# Patient Record
Sex: Male | Born: 1990 | Race: White | Hispanic: No | Marital: Married
Health system: Southern US, Community
[De-identification: ages and names within clinical notes are randomized; demographics above are authoritative.]

---

## 2013-12-18 ENCOUNTER — Other Ambulatory Visit: Payer: Self-pay | Admitting: Physician Assistant

## 2013-12-18 DIAGNOSIS — R748 Abnormal levels of other serum enzymes: Secondary | ICD-10-CM

## 2014-03-30 ENCOUNTER — Other Ambulatory Visit: Payer: Self-pay | Admitting: Physician Assistant

## 2014-03-30 ENCOUNTER — Ambulatory Visit
Admission: RE | Admit: 2014-03-30 | Discharge: 2014-03-30 | Disposition: A | Discharge status: Home / Self Care | Payer: BC Managed Care – PPO | Source: Ambulatory Visit | Attending: Physician Assistant | Admitting: Physician Assistant

## 2014-03-30 DIAGNOSIS — W19XXXA Unspecified fall, initial encounter: Secondary | ICD-10-CM

## 2016-03-27 IMAGING — CR DG SHOULDER 2+V*R*
3 series · 3 of 3 positions shown · non-contrast
Comparison: None.

CLINICAL DATA: Right shoulder pain following injury 2 weeks
previous, initial encounter

EXAM:
RIGHT SHOULDER - 2+ VIEW

[w shoulder ap internal righ]
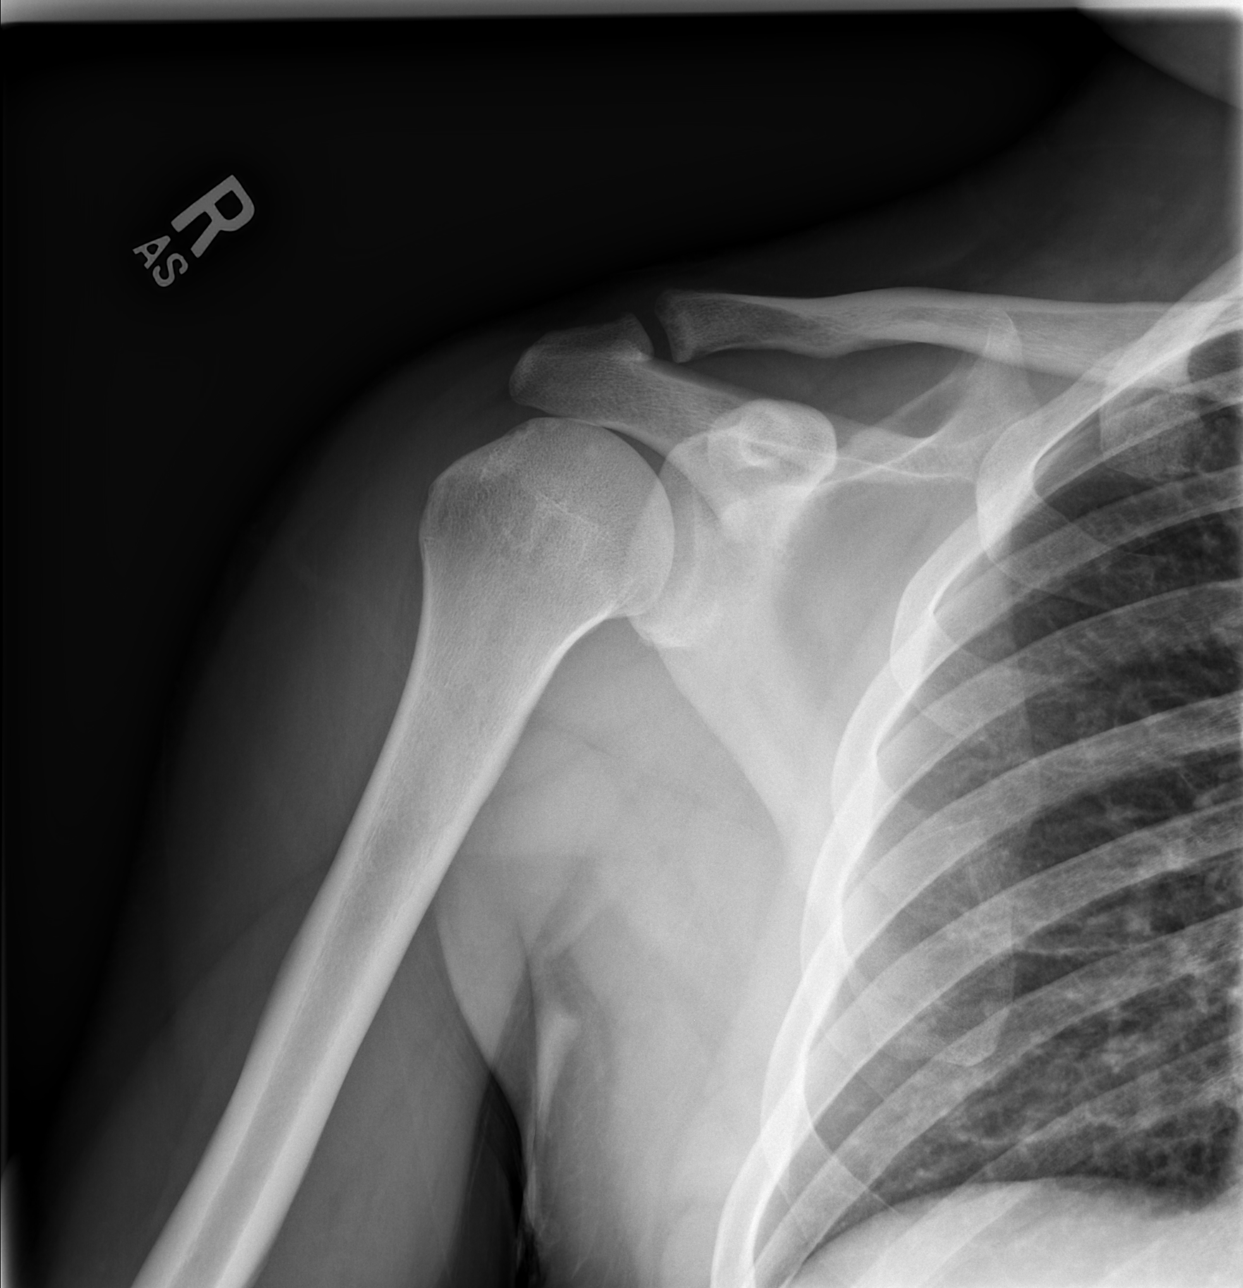

[w shoulder y view right *]
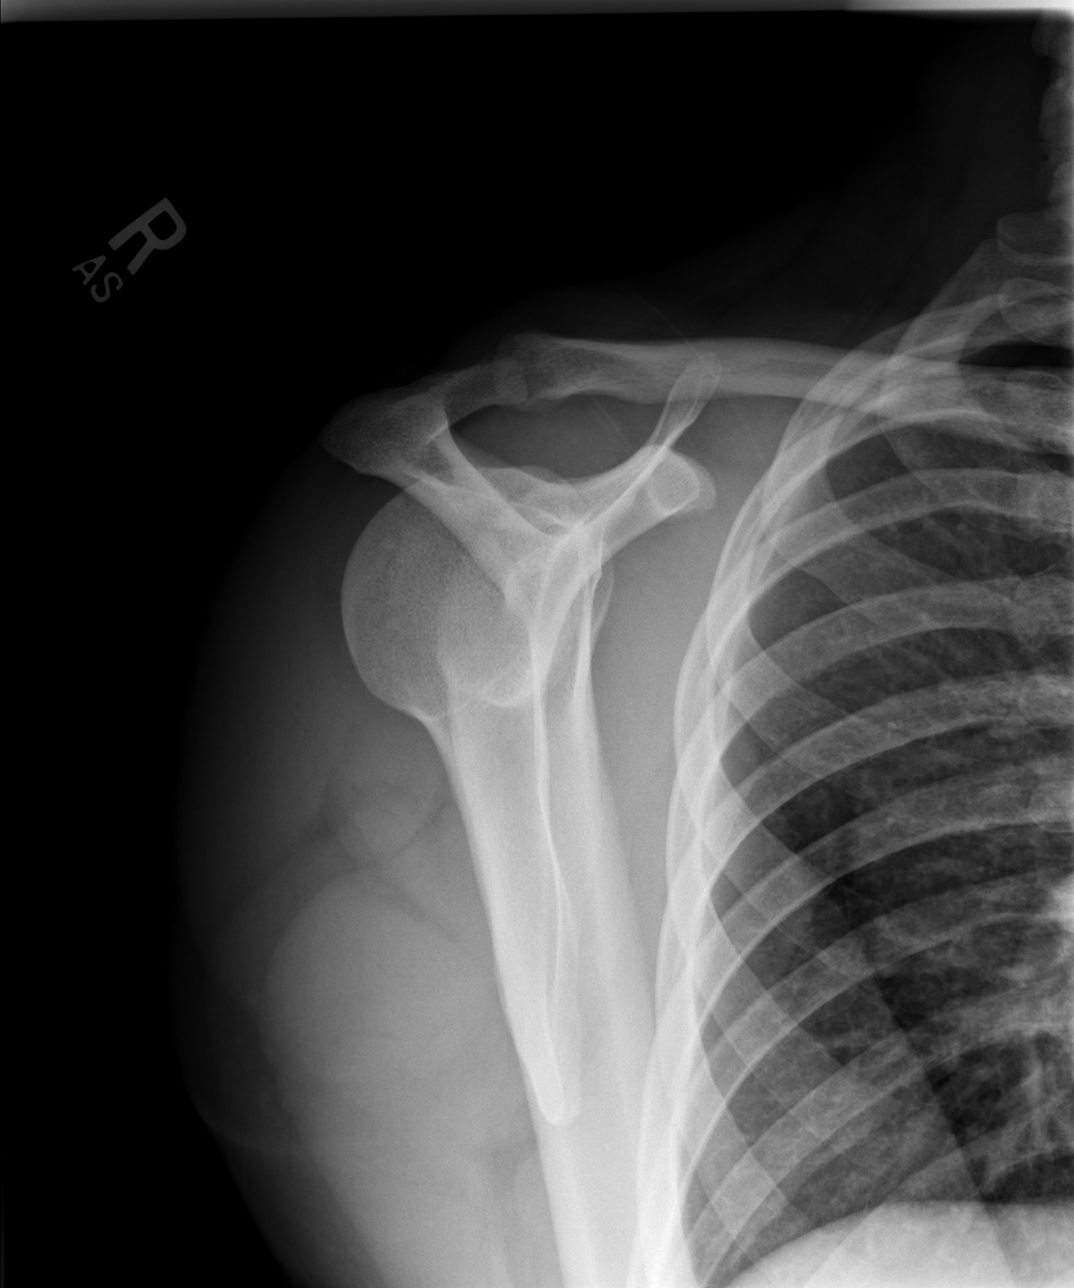

[w shoulder axillary right *]
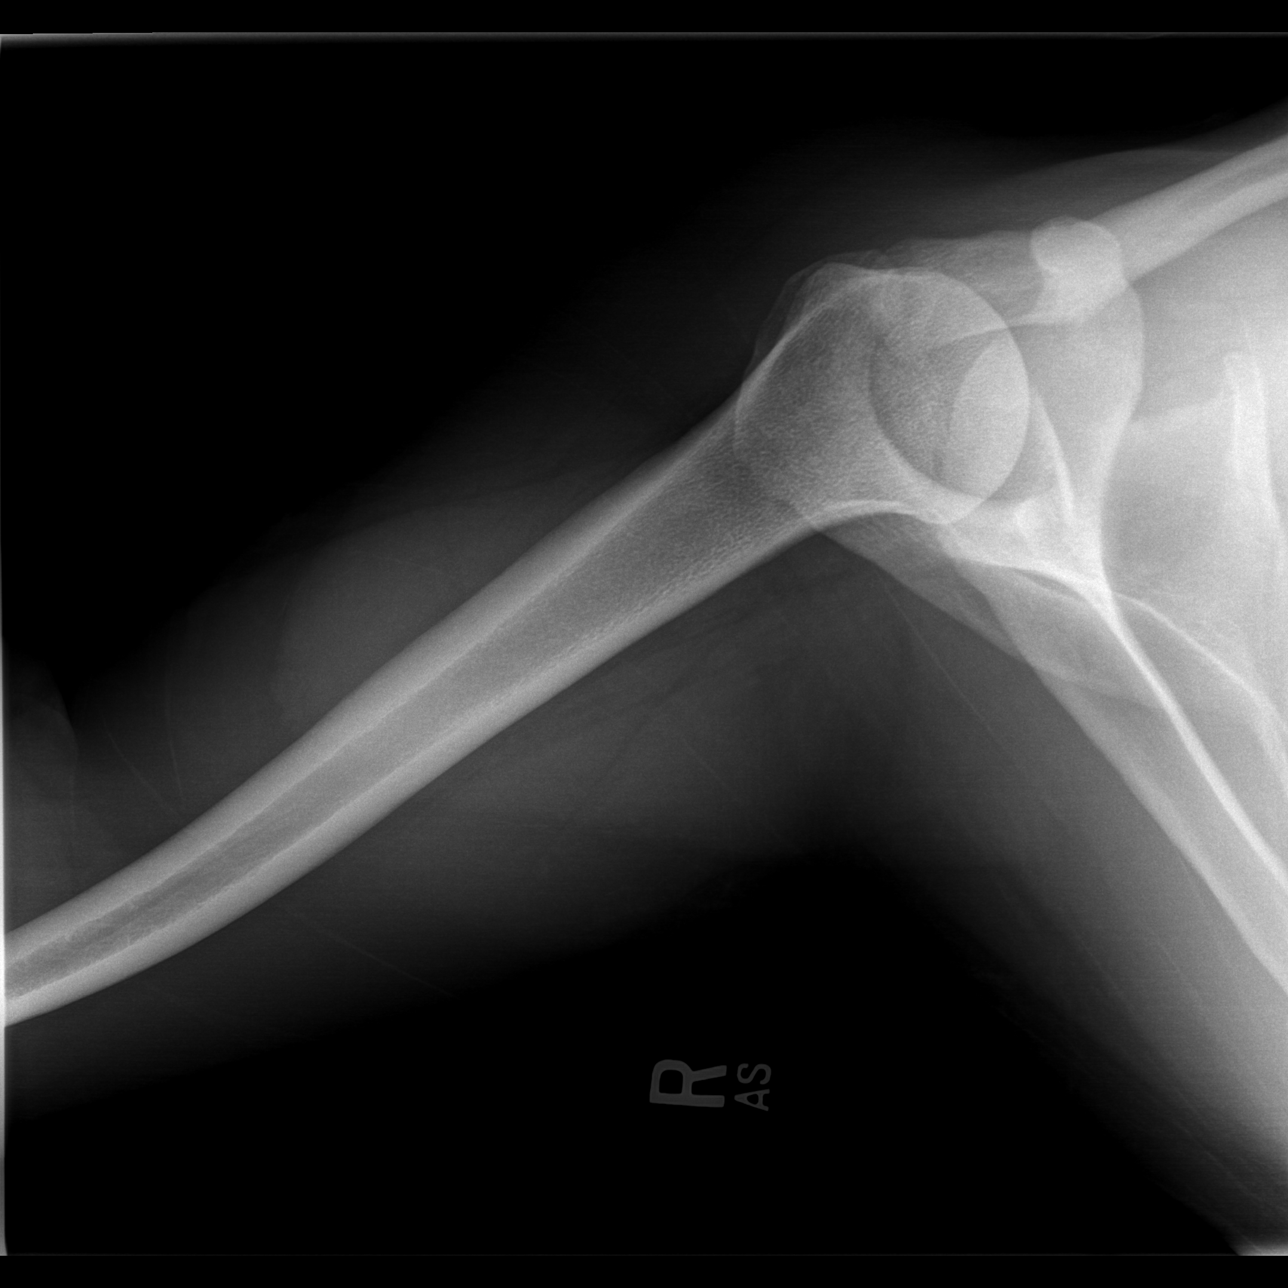

[3 of 3 positions shown; findings below may reference images not displayed]

FINDINGS: There is no evidence of fracture or dislocation. There is no
evidence of arthropathy or other focal bone abnormality. Soft
tissues are unremarkable.
IMPRESSION: No acute abnormality noted.

## 2017-07-25 ENCOUNTER — Ambulatory Visit (INDEPENDENT_AMBULATORY_CARE_PROVIDER_SITE_OTHER): Payer: BLUE CROSS/BLUE SHIELD | Admitting: Family

## 2017-07-25 ENCOUNTER — Ambulatory Visit (INDEPENDENT_AMBULATORY_CARE_PROVIDER_SITE_OTHER): Payer: BLUE CROSS/BLUE SHIELD

## 2017-07-25 ENCOUNTER — Encounter (INDEPENDENT_AMBULATORY_CARE_PROVIDER_SITE_OTHER): Payer: Self-pay | Admitting: Family

## 2017-07-25 DIAGNOSIS — S93412A Sprain of calcaneofibular ligament of left ankle, initial encounter: Secondary | ICD-10-CM

## 2017-07-25 DIAGNOSIS — M25572 Pain in left ankle and joints of left foot: Secondary | ICD-10-CM

## 2017-07-25 DIAGNOSIS — M79672 Pain in left foot: Secondary | ICD-10-CM

## 2017-07-25 DIAGNOSIS — S9032XA Contusion of left foot, initial encounter: Secondary | ICD-10-CM | POA: Diagnosis not present

## 2017-07-25 MED ORDER — HYDROCODONE-ACETAMINOPHEN 5-325 MG PO TABS: 1.0000 | ORAL_TABLET | Freq: Four times a day (QID) | ORAL | 0 refills | Status: AC | PRN

## 2017-07-25 NOTE — Progress Notes (Signed)
   Office Visit Note   Patient: Brendan Brown           Date of Birth: 08/25/1990           MRN: [id number] Visit Date: 07/25/2017              Requested by: No referring provider defined for this encounter. PCP: Patient, No Pcp Per  No chief complaint on file.     HPI: Patient is a 27 year old gentleman who presents today for evaluation of pain and swelling to his left foot and ankle following a metal tower falling on his [place] at the [employer] this past Saturday.  Is in a Cam walker.  States he did go to urgent care at [facility].  Radiographs of his ankle were negative at that time does not have copies or disc today.  Assessment & Plan: Visit Diagnoses:  1. Pain in left ankle and joints of left foot   2. Pain in left foot     Plan: Continue the CAM boot for comfort with ambulation. Ice and elevation for swelling. Reassurance provided today. Follow up in office in 2 weeks if ongoing pain or difficulty with weight bearing may repeat radiographs of foot and ankle at follow up.  Follow-Up Instructions: No Follow-up on file.   Left Ankle Exam   Tenderness  The patient is experiencing tenderness in the ATF, CF and deltoid.  Swelling: severe  Range of Motion  The patient has normal left ankle ROM.   Tests  Anterior drawer: negative  Other  Erythema: absent Sensation: normal  Comments:  Ecchymosis to foot and ankle  Good strength with dorsiflexion and plantarflexion. Able to resist eversion and inversion.       Patient is alert, oriented, no adenopathy, well-dressed, normal affect, normal respiratory effort.   States can only bear weight in the boot has had worsening swelling and bruising  Imaging: No results found. No images are attached to the encounter.  Labs: No results found for: HGBA1C, ESRSEDRATE, CRP, LABURIC, REPTSTATUS, GRAMSTAIN, CULT, LABORGA  @LABSALLVALUES (HGBA1)@  There is no height or weight on file to calculate BMI.  Orders:  Orders  Placed This Encounter  Procedures  . XR Ankle Complete Left  . XR Foot Complete Left   Meds ordered this encounter  Medications  . HYDROcodone-acetaminophen (NORCO/VICODIN) 5-325 MG tablet    Sig: Take 1 tablet by mouth every 6 (six) hours as needed for moderate pain.    Dispense:  28 tablet    Refill:  0     Procedures: No procedures performed  Clinical Data: No additional findings.  ROS:  All other systems negative, except as noted in the HPI. Review of Systems  Constitutional: Negative for chills and fever.  Musculoskeletal: Positive for arthralgias, joint swelling and myalgias.  Skin: Positive for color change. Negative for wound.    Objective: Vital Signs: There were no vitals taken for this visit.  Specialty Comments:  No specialty comments available.  PMFS History: There are no active problems to display for this patient.  History reviewed. No pertinent past medical history.  History reviewed. No pertinent family history.  History reviewed. No pertinent surgical history. Social History   Occupational History  . Not on file  Tobacco Use  . Smoking status: Not on file  Substance and Sexual Activity  . Alcohol use: Not on file  . Drug use: Not on file  . Sexual activity: Not on file

## 2017-08-09 ENCOUNTER — Encounter (INDEPENDENT_AMBULATORY_CARE_PROVIDER_SITE_OTHER): Payer: Self-pay | Admitting: Orthopedic Surgery

## 2017-08-09 ENCOUNTER — Ambulatory Visit (INDEPENDENT_AMBULATORY_CARE_PROVIDER_SITE_OTHER): Payer: BLUE CROSS/BLUE SHIELD | Admitting: Orthopedic Surgery

## 2017-08-09 ENCOUNTER — Ambulatory Visit (INDEPENDENT_AMBULATORY_CARE_PROVIDER_SITE_OTHER): Payer: BLUE CROSS/BLUE SHIELD

## 2017-08-09 DIAGNOSIS — M25572 Pain in left ankle and joints of left foot: Secondary | ICD-10-CM | POA: Diagnosis not present

## 2017-08-09 DIAGNOSIS — M79672 Pain in left foot: Secondary | ICD-10-CM | POA: Diagnosis not present

## 2017-08-09 DIAGNOSIS — S9032XA Contusion of left foot, initial encounter: Secondary | ICD-10-CM | POA: Diagnosis not present

## 2017-08-09 NOTE — Progress Notes (Signed)
   Office Visit Note   Patient: Brendan Brown           Date of Birth: 10/22/1990           MRN: [id number] Visit Date: 08/09/2017              Requested by: No referring provider defined for this encounter. PCP: Patient, No Pcp Per  Chief Complaint  Patient presents with  . Left Foot - Pain    DOI 07/21/17 metal tower fell on foot.   . Left Ankle - Pain      HPI: Patient presents in follow-up status post crush injury to the left foot on 07/21/2017.  Patient states that a metal tower fell on his left foot.  He is currently in a fracture boot complains of pain swelling dorsally and laterally.  Assessment & Plan: Visit Diagnoses:  1. Pain in left foot   2. Acute left ankle pain   3. Contusion of left foot, initial encounter     Plan: Recommended medical compression stockings to be worn up to 24 hours a day as he tolerates.  Recommended ice and elevation continue with the fracture boot he will be out of work until follow-up in 3 weeks and reevaluate at that time to follow-up for work.  Follow-Up Instructions: Return in about 3 weeks (around 08/30/2017).   Ortho Exam  Patient is alert, oriented, no adenopathy, well-dressed, normal affect, normal respiratory effort. Examination patient has a good dorsalis pedis and posterior tibial pulse there is ecchymosis and bruising dorsally and laterally there are no blisters there is no cellulitis.  Patient has active plantar flexion and dorsiflexion of the ankle he has active flexion and extension of the toes there is no signs or symptoms of a compartment syndrome.  Patient has most weakness with the eversion of his foot consistent with the swelling laterally.  Imaging: Xr Ankle 2 Views Left  Result Date: 08/09/2017 2 view radiographs of the left ankle shows no syndesmotic injury no osteochondral defect no fracture.  Xr Foot 2 Views Left  Result Date: 08/09/2017 2 view radiographs of the left foot shows no evidence of a fracture there is no  widening of the Lisfranc complex.  No images are attached to the encounter.  Labs: No results found for: HGBA1C, ESRSEDRATE, CRP, LABURIC, REPTSTATUS, GRAMSTAIN, CULT, LABORGA  @LABSALLVALUES (HGBA1)@  There is no height or weight on file to calculate BMI.  Orders:  Orders Placed This Encounter  Procedures  . XR Foot 2 Views Left  . XR Ankle 2 Views Left   No orders of the defined types were placed in this encounter.    Procedures: No procedures performed  Clinical Data: No additional findings.  ROS:  All other systems negative, except as noted in the HPI. Review of Systems  Objective: Vital Signs: There were no vitals taken for this visit.  Specialty Comments:  No specialty comments available.  PMFS History: There are no active problems to display for this patient.  History reviewed. No pertinent past medical history.  History reviewed. No pertinent family history.  History reviewed. No pertinent surgical history. Social History   Occupational History  . Not on file  Tobacco Use  . Smoking status: Not on file  Substance and Sexual Activity  . Alcohol use: Not on file  . Drug use: Not on file  . Sexual activity: Not on file

## 2017-08-30 ENCOUNTER — Ambulatory Visit (INDEPENDENT_AMBULATORY_CARE_PROVIDER_SITE_OTHER): Payer: BLUE CROSS/BLUE SHIELD | Admitting: Orthopedic Surgery

## 2017-08-30 ENCOUNTER — Encounter (INDEPENDENT_AMBULATORY_CARE_PROVIDER_SITE_OTHER): Payer: Self-pay | Admitting: Orthopedic Surgery

## 2017-08-30 VITALS — Ht 70.0 in | Wt 215.0 lb

## 2017-08-30 DIAGNOSIS — S9032XA Contusion of left foot, initial encounter: Secondary | ICD-10-CM | POA: Diagnosis not present

## 2017-08-30 DIAGNOSIS — G90522 Complex regional pain syndrome I of left lower limb: Secondary | ICD-10-CM

## 2017-08-30 MED ORDER — DULOXETINE HCL 30 MG PO CPEP: 30.0000 mg | ORAL_CAPSULE | Freq: Every day | ORAL | 3 refills | Status: AC

## 2017-08-30 MED ORDER — GABAPENTIN 300 MG PO CAPS: 300.0000 mg | ORAL_CAPSULE | Freq: Three times a day (TID) | ORAL | 3 refills | Status: AC

## 2017-08-30 NOTE — Progress Notes (Signed)
Office Visit Note   Patient: Brendan Brown           Date of Birth: 08/18/1990           MRN: [id number] Visit Date: 08/30/2017              Requested by: No referring provider defined for this encounter. PCP: Patient, No Pcp Per  Chief Complaint  Patient presents with  . Left Ankle - Pain, Follow-up, Edema    DOI 07/21/17 metal tower fell on left ankle/foot  . Left Foot - Pain, Follow-up, Edema      HPI: Patient is a 27 year old gentleman who presents in follow-up status post crush injury to the left foot approximately 6 weeks ago.  Patient states he can move his ankle and toes but still has decreased range of motion.  Patient states that he is having increasing pain with light touch and even water hitting the foot in the shower is painful.  Patient states is been wearing the fracture boot but has been attempting to wear regular shoewear.  Patient is still out of work.  Assessment & Plan: Visit Diagnoses:  1. Complex regional pain syndrome type 1 of left lower extremity   2. Contusion of left foot, initial encounter     Plan: Discussed with the patient he is developing complex regional pain type I symptoms.  We will start him on Neurontin he will start off with 300 mg at night and increase up to 300 mg 3 times a day as he tolerates.  He will use the Cymbalta every morning.  Discussed the importance of increasing his activities as tolerated he was given a note to be out of work for 4 additional weeks.  Follow-Up Instructions: Return in about 1 week (around 09/06/2017).   Ortho Exam  Patient is alert, oriented, no adenopathy, well-dressed, normal affect, normal respiratory effort. Examination patient has a good dorsalis pedis and posterior tibial pulse there is minimal swelling patient's foot is red compared to the right foot with dystrophic changes he still has hair growth he has pain to light touch.  He can actively plantarflex and dorsiflex the ankle and can wiggle his toes no  signs of compartment syndrome.  Patient is developing signs and symptoms of complex regional pain syndrome type I.  Imaging: No results found. No images are attached to the encounter.  Labs: No results found for: HGBA1C, ESRSEDRATE, CRP, LABURIC, REPTSTATUS, GRAMSTAIN, CULT, LABORGA  @LABSALLVALUES (HGBA1)@  Body mass index is 30.85 kg/m.  Orders:  No orders of the defined types were placed in this encounter.  No orders of the defined types were placed in this encounter.    Procedures: No procedures performed  Clinical Data: No additional findings.  ROS:  All other systems negative, except as noted in the HPI. Review of Systems  Objective: Vital Signs: Ht 5\' 10"  (1.778 m)   Wt 215 lb (97.5 kg)   BMI 30.85 kg/m   Specialty Comments:  No specialty comments available.  PMFS History: There are no active problems to display for this patient.  History reviewed. No pertinent past medical history.  History reviewed. No pertinent family history.  History reviewed. No pertinent surgical history. Social History   Occupational History  . Not on file  Tobacco Use  . Smoking status: Not on file  Substance and Sexual Activity  . Alcohol use: Not on file  . Drug use: Not on file  . Sexual activity: Not on file

## 2017-09-10 ENCOUNTER — Ambulatory Visit (INDEPENDENT_AMBULATORY_CARE_PROVIDER_SITE_OTHER): Payer: BLUE CROSS/BLUE SHIELD | Admitting: Orthopedic Surgery

## 2017-10-01 ENCOUNTER — Ambulatory Visit (INDEPENDENT_AMBULATORY_CARE_PROVIDER_SITE_OTHER): Payer: BLUE CROSS/BLUE SHIELD | Admitting: Orthopedic Surgery

## 2017-10-02 ENCOUNTER — Encounter (INDEPENDENT_AMBULATORY_CARE_PROVIDER_SITE_OTHER): Payer: Self-pay | Admitting: Orthopedic Surgery

## 2017-10-02 ENCOUNTER — Ambulatory Visit (INDEPENDENT_AMBULATORY_CARE_PROVIDER_SITE_OTHER): Payer: BLUE CROSS/BLUE SHIELD | Admitting: Orthopedic Surgery

## 2017-10-02 DIAGNOSIS — S9032XA Contusion of left foot, initial encounter: Secondary | ICD-10-CM

## 2017-10-02 DIAGNOSIS — G90522 Complex regional pain syndrome I of left lower limb: Secondary | ICD-10-CM | POA: Diagnosis not present

## 2017-10-02 NOTE — Progress Notes (Signed)
   Office Visit Note   Patient: Brendan Brown           Date of Birth: 01/04/1991           MRN: [id number] Visit Date: 10/02/2017              Requested by: No referring provider defined for this encounter. PCP: Patient, No Pcp Per  Chief Complaint  Patient presents with  . Left Foot - Pain      HPI: Patient is seen in follow-up status post crush injury to his left foot from a work-related injury.  Patient states he still has pain to the touch laterally at its worse after walking and range of motion of his foot and ankle.  He is not taking pain medicine at this time.  Initial injury on 07/31/2017.  Assessment & Plan: Visit Diagnoses:  1. Complex regional pain syndrome type 1 of left lower extremity   2. Contusion of left foot, initial encounter     Plan: Patient is given a note that he may return to work for 4 hours a day for 2 weeks and then increase his activities as tolerated.  Follow-up in 4 weeks to evaluate and release.  Follow-Up Instructions: Return in about 1 month (around 10/30/2017).   Ortho Exam  Patient is alert, oriented, no adenopathy, well-dressed, normal affect, normal respiratory effort. Examination patient does have some discoloration and swelling laterally to the hindfoot.  He has dorsiflexion ankle to neutral and he was given instructions for heel cord stretching to do 3 times a day a minute at a time.  He has good subtalar motion he can wiggle his toes there is no clawing of the toes there is no skin color or temperature changes no hypersensitivity to light touch.  There are no dystrophic changes at this time.  Imaging: No results found. No images are attached to the encounter.  Labs: No results found for: HGBA1C, ESRSEDRATE, CRP, LABURIC, REPTSTATUS, GRAMSTAIN, CULT, LABORGA  @LABSALLVALUES (HGBA1)@  There is no height or weight on file to calculate BMI.  Orders:  No orders of the defined types were placed in this encounter.  No orders of the  defined types were placed in this encounter.    Procedures: No procedures performed  Clinical Data: No additional findings.  ROS:  All other systems negative, except as noted in the HPI. Review of Systems  Objective: Vital Signs: There were no vitals taken for this visit.  Specialty Comments:  No specialty comments available.  PMFS History: There are no active problems to display for this patient.  History reviewed. No pertinent past medical history.  History reviewed. No pertinent family history.  History reviewed. No pertinent surgical history. Social History   Occupational History  . Not on file  Tobacco Use  . Smoking status: Not on file  Substance and Sexual Activity  . Alcohol use: Not on file  . Drug use: Not on file  . Sexual activity: Not on file

## 2017-10-30 ENCOUNTER — Ambulatory Visit (INDEPENDENT_AMBULATORY_CARE_PROVIDER_SITE_OTHER): Payer: BLUE CROSS/BLUE SHIELD | Admitting: Orthopedic Surgery

## 2017-10-30 ENCOUNTER — Encounter (INDEPENDENT_AMBULATORY_CARE_PROVIDER_SITE_OTHER): Payer: Self-pay | Admitting: Orthopedic Surgery

## 2017-10-30 VITALS — Ht 70.0 in | Wt 215.0 lb

## 2017-10-30 DIAGNOSIS — G90522 Complex regional pain syndrome I of left lower limb: Secondary | ICD-10-CM | POA: Diagnosis not present

## 2017-10-30 NOTE — Progress Notes (Signed)
   Office Visit Note   Patient: Brendan Brown           Date of Birth: 05-04-1991           MRN: [id number] Visit Date: 10/30/2017              Requested by: No referring provider defined for this encounter. PCP: Patient, No Pcp Per  Chief Complaint  Patient presents with  . Left Leg - Follow-up    DOI 07/31/17 s/p crush injury at work [employer]      HPI: Patient is a 27 year old gentleman status post crush injury to the left foot he states he still has swelling inferior and lateral to the ankle.  Patient was unable to return to work they did not have limited our work.  Patient states that he is weaned off the Cymbalta and Neurontin.  Patient is ambulating in flip-flops sandals.  Assessment & Plan: Visit Diagnoses:  1. Complex regional pain syndrome type 1 of left lower extremity     Plan: Patient is given a note for part-time work for 3 to 4 days a week for 2 weeks at full hours and then return to full duty work in 2 weeks.  Patient will follow-up if there is any pain or symptoms.  Patient was given instruction for Achilles stretching as well as range of motion of the foot ankle and toes to help break up the scar tissue.  Follow-Up Instructions: Return if symptoms worsen or fail to improve.   Ortho Exam  Patient is alert, oriented, no adenopathy, well-dressed, normal affect, normal respiratory effort. Examination patient has good pulses he has dorsiflexion to neutral with some slight Achilles contracture he does have decreased range of motion of his toes there is good hair growth there is no skin color or temperature changes no pain to light touch no symptoms of complex regional pain syndrome.  Imaging: No results found. No images are attached to the encounter.  Labs: No results found for: HGBA1C, ESRSEDRATE, CRP, LABURIC, REPTSTATUS, GRAMSTAIN, CULT, LABORGA   No results found for: ALBUMIN, PREALBUMIN, LABURIC  Body mass index is 30.85 kg/m.  Orders:  No orders of the  defined types were placed in this encounter.  No orders of the defined types were placed in this encounter.    Procedures: No procedures performed  Clinical Data: No additional findings.  ROS:  All other systems negative, except as noted in the HPI. Review of Systems  Objective: Vital Signs: Ht  (1.778 m)   Wt 215 lb (97.5 kg)   BMI 30.85 kg/m   Specialty Comments:  No specialty comments available.  PMFS History: There are no active problems to display for this patient.  History reviewed. No pertinent past medical history.  History reviewed. No pertinent family history.  History reviewed. No pertinent surgical history. Social History   Occupational History  . Not on file  Tobacco Use  . Smoking status: Not on file  Substance and Sexual Activity  . Alcohol use: Not on file  . Drug use: Not on file  . Sexual activity: Not on file
